# Patient Record
Sex: Female | Born: 1965 | Hispanic: Yes | Marital: Married
Health system: Southern US, Community
[De-identification: ages and names within clinical notes are randomized; demographics above are authoritative.]

---

## 2004-04-08 ENCOUNTER — Ambulatory Visit: Payer: Self-pay | Admitting: Urology

## 2004-05-26 ENCOUNTER — Ambulatory Visit: Payer: Self-pay | Admitting: Family Medicine

## 2007-08-07 ENCOUNTER — Ambulatory Visit: Payer: Self-pay | Admitting: Certified Nurse Midwife

## 2015-12-23 ENCOUNTER — Ambulatory Visit
Admission: RE | Admit: 2015-12-23 | Discharge: 2015-12-23 | Disposition: A | Discharge status: Home / Self Care | Payer: Self-pay | Source: Ambulatory Visit | Attending: Oncology | Admitting: Oncology

## 2015-12-23 ENCOUNTER — Encounter: Payer: Self-pay | Admitting: *Deleted

## 2015-12-23 ENCOUNTER — Ambulatory Visit: Payer: Self-pay | Attending: Oncology | Admitting: *Deleted

## 2015-12-23 VITALS — BP 113/83 | HR 67 | Temp 97.5°F | Ht 66.14 in | Wt 196.0 lb

## 2015-12-23 DIAGNOSIS — Z0000 Encounter for general adult medical examination without abnormal findings: Secondary | ICD-10-CM

## 2015-12-23 NOTE — Progress Notes (Signed)
Subjective:     Patient ID: Brittany Cooke, female   DOB: 1966-02-03, 50 y.o.   MRN: [id number]  HPI   Review of Systems     Objective:   Physical Exam  Pulmonary/Chest: Right breast exhibits no inverted nipple, no mass, no nipple discharge, no skin change and no tenderness. Left breast exhibits no inverted nipple, no mass, no nipple discharge, no skin change and no tenderness. Breasts are symmetrical.  Abdominal: There is no splenomegaly or hepatomegaly.  Genitourinary: No labial fusion. There is no rash, tenderness, lesion or injury on the right labia. There is no rash, tenderness, lesion or injury on the left labia. Cervix exhibits no motion tenderness, no discharge and no friability. Right adnexum displays no mass, no tenderness and no fullness. Left adnexum displays no mass, no tenderness and no fullness. No erythema, tenderness or bleeding in the vagina. No foreign body in the vagina. Vaginal discharge found.    Genitourinary Comments: Non-odorous tan discharge noted vaginally       Assessment:     50 year old English speaking Hispanic female [facility], pap and mammogram.  Clinical breast exam unremarkable.  Taught self breast awareness.  Specimen collected for pap smear.  Patient has been screened for eligibility.  She does not have any insurance, Medicare or Medicaid.  She also meets financial eligibility.  Hand-out given on the Affordable Care Act.    Plan:     Screening mammogram ordered.  Specimen for pap sent to the lab.  Will follow-up per [facility] protocol.

## 2015-12-23 NOTE — Patient Instructions (Signed)
Human Papillomavirus Human papillomavirus (HPV) is the most common sexually transmitted infection (STI) and is highly contagious. HPV infections cause genital warts and cancers to the outlet of the womb (cervix), birth canal (vagina), opening of the birth canal (vulva), and anus. There are over 100 types of HPV. Unless wartlike lesions are present in the throat or there are genital warts that you can see or feel, HPV usually does not cause symptoms. It is possible to be infected for long periods and pass it on to others without knowing it. CAUSES  HPV is spread from person to person through sexual contact. This includes oral, vaginal, or anal sex. RISK FACTORS  Having unprotected sex. HPV can be spread by oral, vaginal, or anal sex.  Having several sex partners.  Having a sex partner who has other sex partners.  Having or having had another sexually transmitted infection. SIGNS AND SYMPTOMS  Most people carrying HPV do not have any symptoms. If symptoms are present, symptoms may include:  Wartlike lesions in the throat (from having oral sex).  Warts in the infected skin or mucous membranes.  Genital warts that may itch, burn, or bleed.  Genital warts that may be painful or bleed during sexual intercourse. DIAGNOSIS  If wartlike lesions are present in the throat or genital warts are present, your health care provider can usually diagnose HPV by physical examination.   Genital warts are easily seen with the naked eye.  Currently, there is no FDA-approved test to detect HPV in males.  In females, a Pap test can show cells that are infected with HPV.  In females, a scope can be used to view the cervix (colposcopy). A colposcopy can be performed if the pelvic exam or Pap test is abnormal. A sample of tissue may be removed (biopsy) during the colposcopy. TREATMENT  There is no treatment for the virus itself. However, there are treatments for the health problems and symptoms HPV can cause.  Your health care provider will follow you closely after you are treated. This is because the HPV can come back and may need treatment again. Treatment of HPV may include:   Medicines, which may be injected or applied in a cream, lotion, or gel form.  Use of a probe to apply extreme cold (cryotherapy).  Application of an intense beam of light (laser treatment).  Use of a probe to apply extreme heat (electrocautery).  Surgery. HOME CARE INSTRUCTIONS   Take medicines only as directed by your health care provider.  Use over-the-counter creams for itching or irritation as directed by your health care provider.  Keep all follow-up visits as directed by your health care provider. This is important.  Do not touch or scratch the warts.  Do not treat genital warts with medicines used for treating hand warts.  Do not have sex while you are being treated.  Do not douche or use tampons during treatment of HPV.  Tell your sex partner about your infection because he or she may also need treatment.  If you become pregnant, tell your health care provider that you have had HPV. Your health care provider will monitor you closely during pregnancy to be sure your baby is safe.  After treatment, use condoms during sex to prevent future infections.  Have only one sex partner.  Have a sex partner who does not have other sex partners. PREVENTION   Talk to your health care provider about getting the HPV vaccines. These vaccines prevent some HPV infections and cancers.   It is recommended that the vaccine be given to males and females between the ages of 9 and 26 years old. It will not work if you already have HPV, and it is not recommended for pregnant women.  A Pap test is done to screen for cervical cancer in women.  The first Pap test should be done at age 21 years.  Between ages 21 and 29 years, Pap tests are repeated every 2 years.  Beginning at age 30, you are advised to have a Pap test every  3 years as long as your past 3 Pap tests have been normal.  Some women have medical problems that increase the chance of getting cervical cancer. Talk to your health care provider about these problems. It is especially important to talk to your health care provider if a new problem develops soon after your last Pap test. In these cases, your health care provider may recommend more frequent screening and Pap tests.  The above recommendations are the same for women who have or have not gotten the vaccine for HPV.  If you had a hysterectomy for a problem that was not a cancer or a condition that could lead to cancer, then you no longer need Pap tests. However, even if you no longer need a Pap test, a regular exam is a good idea to make sure no other problems are starting.   If you are between the ages of 65 and 70 years and you have had normal Pap tests going back 10 years, you no longer need Pap tests. However, even if you no longer need a Pap test, a regular exam is a good idea to make sure no other problems are starting.  If you have had past treatment for cervical cancer or a condition that could lead to cancer, you need Pap tests and screening for cancer for at least 20 years after your treatment.  If Pap tests have been discontinued, risk factors (such as a new sexual partner)need to be reassessed to determine if screening should be resumed.  Some women may need screenings more often if they are at high risk for cervical cancer. SEEK MEDICAL CARE IF:   The treated skin becomes red, swollen, or painful.  You have a fever.  You feel generally ill.  You feel lumps or pimple-like projections in and around your genital area.  You develop bleeding of the vagina or the treatment area.  You have painful sexual intercourse. MAKE SURE YOU:   Understand these instructions.  Will watch your condition.  Will get help if you are not doing well or get worse.   This information is not  intended to replace advice given to you by your health care provider. Make sure you discuss any questions you have with your health care provider.   Document Released: 04/23/2003 Document Revised: 02/21/2014 Document Reviewed: 05/08/2013 Elsevier Interactive Patient Education 2016 [employer]   Gave patient hand-out, Women Staying Healthy, Active and Well from [facility], with education on breast health, pap smears, heart and colon health. 

## 2015-12-24 ENCOUNTER — Other Ambulatory Visit: Payer: Self-pay

## 2015-12-24 DIAGNOSIS — N63 Unspecified lump in unspecified breast: Secondary | ICD-10-CM

## 2015-12-28 LAB — PAP LB AND HPV HIGH-RISK
HPV, HIGH-RISK: NEGATIVE
PAP SMEAR COMMENT: 0

## 2016-01-14 ENCOUNTER — Ambulatory Visit
Admission: RE | Admit: 2016-01-14 | Discharge: 2016-01-14 | Disposition: A | Discharge status: Home / Self Care | Payer: Self-pay | Source: Ambulatory Visit | Attending: Oncology | Admitting: Oncology

## 2016-01-14 DIAGNOSIS — N63 Unspecified lump in unspecified breast: Secondary | ICD-10-CM

## 2016-01-15 ENCOUNTER — Other Ambulatory Visit: Payer: Self-pay | Admitting: *Deleted

## 2016-01-15 ENCOUNTER — Encounter: Payer: Self-pay | Admitting: *Deleted

## 2016-01-15 DIAGNOSIS — N63 Unspecified lump in unspecified breast: Secondary | ICD-10-CM

## 2016-01-15 NOTE — Progress Notes (Signed)
Letter mailed to inform patient of her mammogram results and need for a six month follow-up.  Appointment scheduled for 07/20/16 @ 11:00.

## 2016-07-15 ENCOUNTER — Ambulatory Visit
Admission: RE | Admit: 2016-07-15 | Discharge: 2016-07-15 | Disposition: A | Discharge status: Home / Self Care | Payer: Self-pay | Source: Ambulatory Visit | Attending: Oncology | Admitting: Oncology

## 2016-07-15 DIAGNOSIS — N63 Unspecified lump in unspecified breast: Secondary | ICD-10-CM

## 2016-07-20 ENCOUNTER — Ambulatory Visit: Payer: Self-pay | Attending: Oncology

## 2016-08-11 ENCOUNTER — Encounter: Payer: Self-pay | Admitting: *Deleted

## 2016-08-11 NOTE — Progress Notes (Signed)
Patient is to follow up with annual screening in November 2018.  [facility] [place] [facility].

## 2017-01-09 ENCOUNTER — Encounter: Payer: Self-pay | Admitting: *Deleted

## 2017-03-15 ENCOUNTER — Encounter (INDEPENDENT_AMBULATORY_CARE_PROVIDER_SITE_OTHER): Payer: Self-pay

## 2017-03-15 ENCOUNTER — Ambulatory Visit: Payer: Self-pay | Attending: Oncology

## 2017-03-15 ENCOUNTER — Ambulatory Visit
Admission: RE | Admit: 2017-03-15 | Discharge: 2017-03-15 | Disposition: A | Discharge status: Home / Self Care | Payer: Self-pay | Source: Ambulatory Visit | Attending: Oncology | Admitting: Oncology

## 2017-03-15 VITALS — BP 127/85 | HR 81 | Temp 97.7°F | Resp 18 | Ht 66.14 in | Wt 197.8 lb

## 2017-03-15 DIAGNOSIS — Z0000 Encounter for general adult medical examination without abnormal findings: Secondary | ICD-10-CM

## 2017-03-15 NOTE — Progress Notes (Signed)
Subjective:     Patient ID: Brittany Cooke, female   DOB: August 11, 1965, 52 y.o.   MRN: [id number]  HPI   Review of Systems     Objective:   Physical Exam  Pulmonary/Chest: Right breast exhibits no inverted nipple, no mass, no nipple discharge, no skin change and no tenderness. Left breast exhibits no inverted nipple, no mass, no nipple discharge, no skin change and no tenderness. Breasts are symmetrical.       Assessment:     52 year old hispanic pateint, fluent in [place], returns for annual [facility] screening.  Patient had a benign right breast cyst on previous imaging.  Patient screened, and meets [facility] eligibility.  Patient does not have insurance, Medicare or Medicaid.  Handout given on Affordable Care Act.  Instructed patient on breast self awareness using teach back method.  CBE unremarkable.  No mass or lump palpated.  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    Plan:     Sent for bilateral screening mammogram.

## 2017-03-17 NOTE — Progress Notes (Signed)
Letter mailed from [facility] to notify of normal mammogram results.  Patient to return in one year for annual screening.  Copy to [facility]. 

## 2018-02-26 ENCOUNTER — Ambulatory Visit: Payer: Self-pay | Attending: Oncology | Admitting: *Deleted

## 2018-02-26 ENCOUNTER — Ambulatory Visit
Admission: RE | Admit: 2018-02-26 | Discharge: 2018-02-26 | Disposition: A | Discharge status: Home / Self Care | Payer: Self-pay | Source: Ambulatory Visit | Attending: Oncology | Admitting: Oncology

## 2018-02-26 ENCOUNTER — Encounter (INDEPENDENT_AMBULATORY_CARE_PROVIDER_SITE_OTHER): Payer: Self-pay

## 2018-02-26 ENCOUNTER — Encounter: Payer: Self-pay | Admitting: *Deleted

## 2018-02-26 VITALS — BP 131/78 | HR 74 | Temp 97.9°F | Ht 65.25 in | Wt 188.0 lb

## 2018-02-26 DIAGNOSIS — Z0000 Encounter for general adult medical examination without abnormal findings: Secondary | ICD-10-CM

## 2018-02-26 NOTE — Progress Notes (Signed)
  Subjective:     Patient ID: Brittany Cooke, female   DOB: Jun 29, 1965, 53 y.o.   MRN: [id number]  HPI   Review of Systems     Objective:   Physical Exam Chest:     Breasts:        Right: No swelling, bleeding, inverted nipple, mass, nipple discharge, skin change or tenderness.        Left: No swelling, bleeding, inverted nipple, mass, nipple discharge, skin change or tenderness.    Lymphadenopathy:     Upper Body:     Right upper body: No supraclavicular or axillary adenopathy.     Left upper body: No supraclavicular or axillary adenopathy.        Assessment:     53 year old Hispanic female returns to [facility] for annual screening. Patient states she has right retroareolar pain prior to her periods.  States her primary care provider told her it was normal.  Reviewed with patient that if the pain persist or is not associated with her cycle she is to call us back and we can re-evaluate her at that time.  Clinical breast exam without mass, skin changes, nipple discharge or lymphadenopathy.  Last pap on 12/23/15 was negative / negative.  Next pap due in 2022. Patient has been screened for eligibility.  She does not have any insurance, Medicare or Medicaid.  She also meets financial eligibility.  Hand-out given on the Affordable Care Act.  Risk Assessment    Risk Scores      02/26/2018   Last edited by: Alta Corning, CMA   5-year risk: 0.6 %   Lifetime risk: 5.2 %            Plan:     Screening mammogram ordered.  Will follow up per [facility] protocol.

## 2018-02-26 NOTE — Patient Instructions (Signed)
Gave patient hand-out, Women Staying Healthy, Active and Well from [facility], with education on breast health, pap smears, heart and colon health. 

## 2018-02-27 ENCOUNTER — Encounter: Payer: Self-pay | Admitting: *Deleted

## 2018-02-27 NOTE — Progress Notes (Signed)
Letter mailed from [facility] to inform patient of her normal mammogram results.  Patient is to follow-up with annual screening in one year.  [facility] to [facility]. 

## 2018-10-30 ENCOUNTER — Other Ambulatory Visit: Payer: Self-pay | Admitting: Internal Medicine

## 2018-10-30 DIAGNOSIS — Z20822 Contact with and (suspected) exposure to covid-19: Secondary | ICD-10-CM

## 2018-11-01 LAB — NOVEL CORONAVIRUS, NAA: SARS-CoV-2, NAA: NOT DETECTED

## 2019-04-14 ENCOUNTER — Other Ambulatory Visit: Payer: Self-pay | Admitting: Critical Care Medicine

## 2019-04-14 ENCOUNTER — Other Ambulatory Visit: Payer: Self-pay

## 2019-04-14 DIAGNOSIS — Z20822 Contact with and (suspected) exposure to covid-19: Secondary | ICD-10-CM

## 2019-04-15 LAB — NOVEL CORONAVIRUS, NAA: SARS-CoV-2, NAA: NOT DETECTED

## 2020-08-06 LAB — EXTERNAL GENERIC LAB PROCEDURE: COLOGUARD: NEGATIVE

## 2020-08-06 LAB — COLOGUARD: COLOGUARD: NEGATIVE

## 2020-09-10 IMAGING — MG DIGITAL SCREENING BILATERAL MAMMOGRAM WITH TOMO AND CAD
8 series · 8 of 24 positions shown · non-contrast
Comparison: Previous exam(s).

CLINICAL DATA: Screening.

EXAM:
DIGITAL SCREENING BILATERAL MAMMOGRAM WITH TOMO AND CAD

[L CC synth-2D]
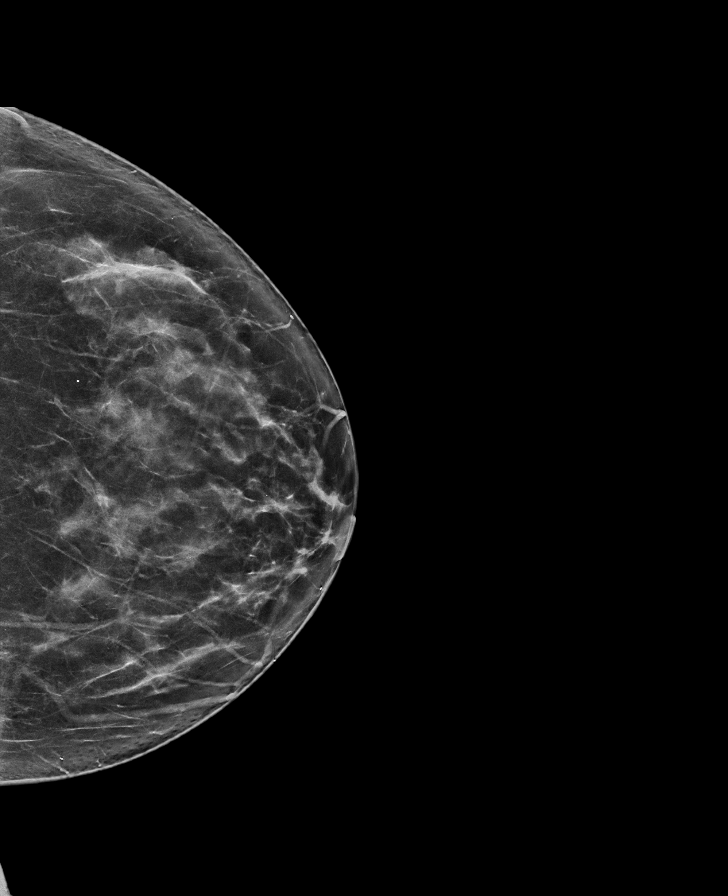

[R CC synth-2D]
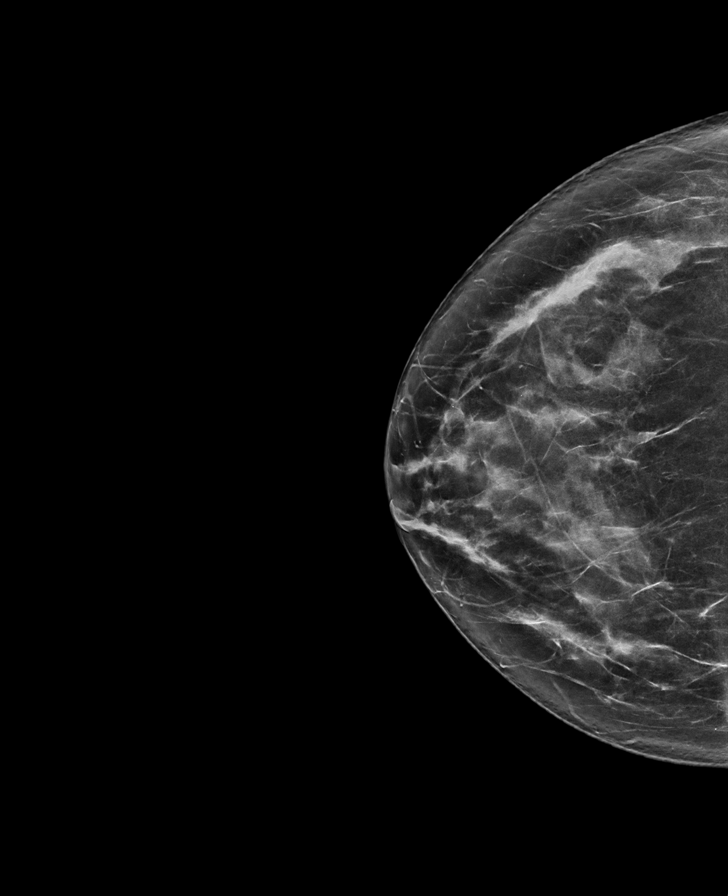

[R MLO synth-2D]
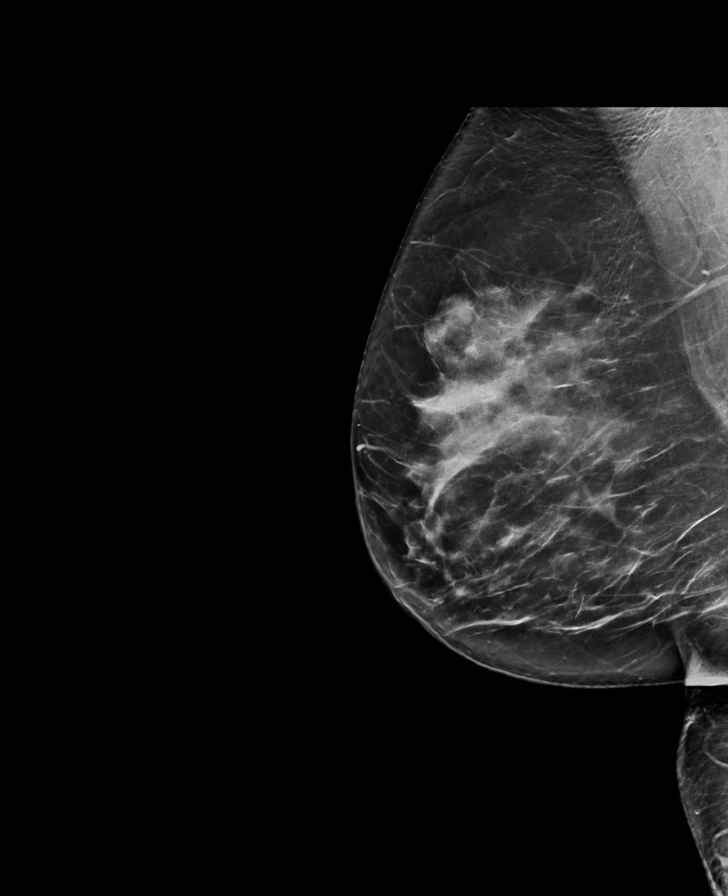

[L MLO synth-2D]
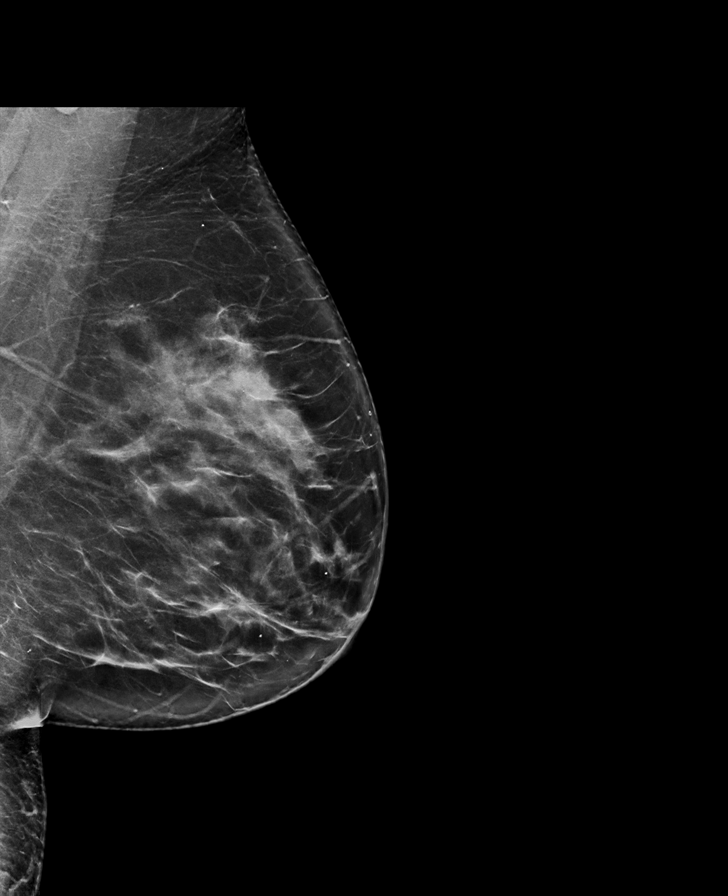

[L MLO tomo · tomo slice 45/88.0]
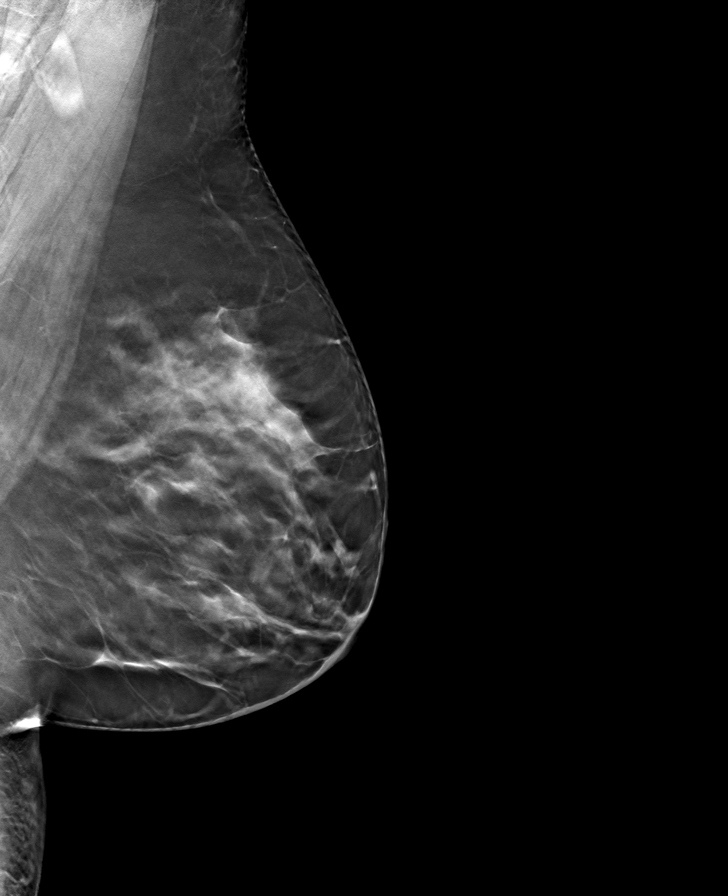

[R CC tomo · tomo slice 39/77.0]
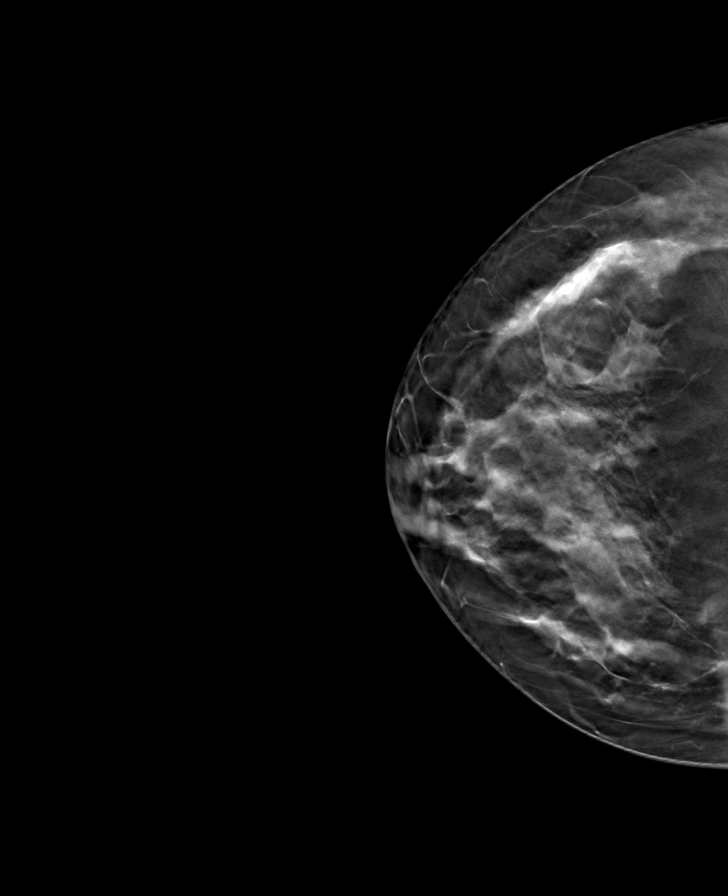

[R MLO tomo · tomo slice 43/84.0]
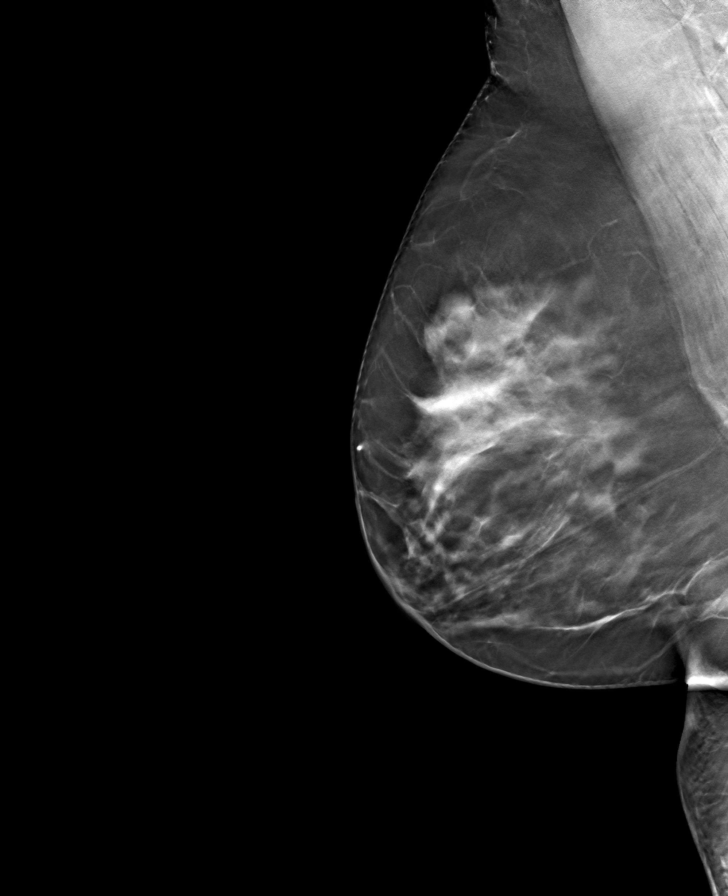

[L CC tomo · tomo slice 39/78.0]
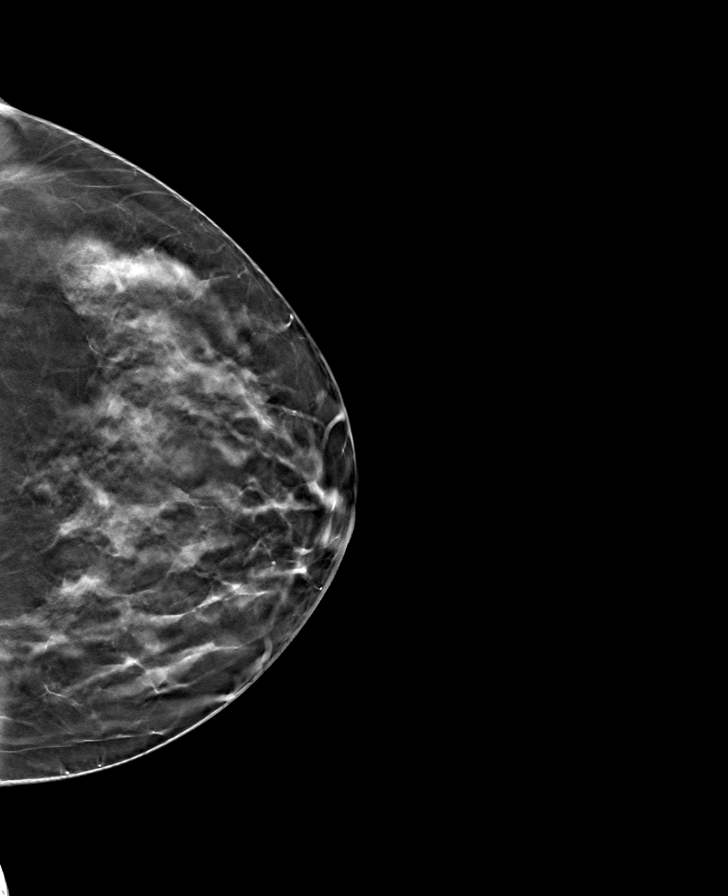

[8 of 24 positions shown; findings below may reference images not displayed]

ACR Breast Density Category c: The breast tissue is heterogeneously
dense, which may obscure small masses.
FINDINGS: There are no findings suspicious for malignancy. Images were
processed with CAD.
IMPRESSION: No mammographic evidence of malignancy. A result letter of this
screening mammogram will be mailed directly to the patient.

RECOMMENDATION:
Screening mammogram in one year. (Code:FT-U-LHB)

BI-RADS CATEGORY  1: Negative.

## 2020-10-07 ENCOUNTER — Other Ambulatory Visit: Payer: Self-pay

## 2020-10-07 DIAGNOSIS — Z1231 Encounter for screening mammogram for malignant neoplasm of breast: Secondary | ICD-10-CM

## 2020-10-21 ENCOUNTER — Ambulatory Visit
Admission: RE | Admit: 2020-10-21 | Discharge: 2020-10-21 | Disposition: A | Discharge status: Home / Self Care | Payer: BC Managed Care – PPO | Source: Ambulatory Visit

## 2020-10-21 ENCOUNTER — Other Ambulatory Visit: Payer: Self-pay

## 2020-10-21 DIAGNOSIS — Z1231 Encounter for screening mammogram for malignant neoplasm of breast: Secondary | ICD-10-CM | POA: Insufficient documentation

## 2021-05-31 ENCOUNTER — Ambulatory Visit
Admission: RE | Admit: 2021-05-31 | Discharge: 2021-05-31 | Disposition: A | Discharge status: Home / Self Care | Payer: BC Managed Care – PPO | Source: Ambulatory Visit

## 2021-05-31 DIAGNOSIS — Z1231 Encounter for screening mammogram for malignant neoplasm of breast: Secondary | ICD-10-CM | POA: Diagnosis present

## 2021-06-01 ENCOUNTER — Other Ambulatory Visit: Payer: Self-pay | Admitting: *Deleted

## 2021-06-01 ENCOUNTER — Inpatient Hospital Stay
Admission: RE | Admit: 2021-06-01 | Discharge: 2021-06-01 | Disposition: A | Discharge status: Home / Self Care | Payer: Self-pay | Source: Ambulatory Visit | Attending: *Deleted | Admitting: *Deleted

## 2021-06-01 DIAGNOSIS — Z1231 Encounter for screening mammogram for malignant neoplasm of breast: Secondary | ICD-10-CM

## 2022-06-16 ENCOUNTER — Other Ambulatory Visit: Payer: Self-pay | Admitting: Infectious Diseases

## 2022-06-16 DIAGNOSIS — Z1231 Encounter for screening mammogram for malignant neoplasm of breast: Secondary | ICD-10-CM

## 2022-07-26 ENCOUNTER — Encounter: Payer: Self-pay | Admitting: Radiology

## 2022-07-26 ENCOUNTER — Ambulatory Visit
Admission: RE | Admit: 2022-07-26 | Discharge: 2022-07-26 | Disposition: A | Discharge status: Home / Self Care | Payer: BC Managed Care – PPO | Source: Ambulatory Visit | Attending: Infectious Diseases | Admitting: Infectious Diseases

## 2022-07-26 DIAGNOSIS — Z1231 Encounter for screening mammogram for malignant neoplasm of breast: Secondary | ICD-10-CM | POA: Diagnosis present

## 2022-07-29 ENCOUNTER — Other Ambulatory Visit: Payer: Self-pay | Admitting: Infectious Diseases

## 2022-07-29 DIAGNOSIS — R928 Other abnormal and inconclusive findings on diagnostic imaging of breast: Secondary | ICD-10-CM

## 2022-07-29 DIAGNOSIS — R921 Mammographic calcification found on diagnostic imaging of breast: Secondary | ICD-10-CM

## 2022-09-07 ENCOUNTER — Other Ambulatory Visit: Payer: Self-pay | Admitting: Infectious Diseases

## 2022-09-07 ENCOUNTER — Ambulatory Visit
Admission: RE | Admit: 2022-09-07 | Discharge: 2022-09-07 | Disposition: A | Discharge status: Home / Self Care | Payer: BC Managed Care – PPO | Source: Ambulatory Visit | Attending: Infectious Diseases | Admitting: Infectious Diseases

## 2022-09-07 DIAGNOSIS — R928 Other abnormal and inconclusive findings on diagnostic imaging of breast: Secondary | ICD-10-CM | POA: Insufficient documentation

## 2022-09-07 DIAGNOSIS — R921 Mammographic calcification found on diagnostic imaging of breast: Secondary | ICD-10-CM | POA: Insufficient documentation

## 2022-09-13 ENCOUNTER — Ambulatory Visit
Admission: RE | Admit: 2022-09-13 | Discharge: 2022-09-13 | Disposition: A | Discharge status: Home / Self Care | Payer: BC Managed Care – PPO | Source: Ambulatory Visit | Attending: Infectious Diseases | Admitting: Infectious Diseases

## 2022-09-13 DIAGNOSIS — R928 Other abnormal and inconclusive findings on diagnostic imaging of breast: Secondary | ICD-10-CM | POA: Insufficient documentation

## 2022-09-13 DIAGNOSIS — R921 Mammographic calcification found on diagnostic imaging of breast: Secondary | ICD-10-CM

## 2022-09-13 HISTORY — PX: BREAST BIOPSY: SHX20

## 2022-09-13 MED ORDER — LIDOCAINE 1 % OPTIME INJ - NO CHARGE
5.0000 mL | Freq: Once | INTRAMUSCULAR | Status: AC
  Administered 2022-09-13: 5 mL
  Filled 2022-09-13: qty 6

## 2022-09-13 MED ORDER — LIDOCAINE-EPINEPHRINE 1 %-1:100000 IJ SOLN
20.0000 mL | Freq: Once | INTRAMUSCULAR | Status: AC
  Administered 2022-09-13: 20 mL
  Filled 2022-09-13: qty 20
